# Patient Record
Sex: Male | Born: 1960 | Race: White | Hispanic: No | Marital: Married | Smoking: Never smoker
Health system: Southern US, Community
[De-identification: ages and names within clinical notes are randomized; demographics above are authoritative.]

## PROBLEM LIST (undated history)

## (undated) DIAGNOSIS — R5382 Chronic fatigue, unspecified: Secondary | ICD-10-CM

## (undated) DIAGNOSIS — G35 Multiple sclerosis: Secondary | ICD-10-CM

## (undated) DIAGNOSIS — G35D Multiple sclerosis, unspecified: Secondary | ICD-10-CM

## (undated) DIAGNOSIS — G8929 Other chronic pain: Secondary | ICD-10-CM

## (undated) DIAGNOSIS — L409 Psoriasis, unspecified: Secondary | ICD-10-CM

## (undated) HISTORY — PX: EYE SURGERY: SHX253

## (undated) HISTORY — PX: APPENDECTOMY: SHX54

---

## 2015-03-17 ENCOUNTER — Ambulatory Visit
Admission: EM | Admit: 2015-03-17 | Discharge: 2015-03-17 | Disposition: A | Discharge status: Home / Self Care | Payer: 59 | Attending: Family Medicine | Admitting: Family Medicine

## 2015-03-17 ENCOUNTER — Ambulatory Visit: Payer: 59

## 2015-03-17 ENCOUNTER — Encounter: Payer: Self-pay | Admitting: Emergency Medicine

## 2015-03-17 DIAGNOSIS — S9031XA Contusion of right foot, initial encounter: Secondary | ICD-10-CM | POA: Diagnosis not present

## 2015-03-17 DIAGNOSIS — S92901A Unspecified fracture of right foot, initial encounter for closed fracture: Secondary | ICD-10-CM

## 2015-03-17 DIAGNOSIS — W19XXXA Unspecified fall, initial encounter: Secondary | ICD-10-CM | POA: Insufficient documentation

## 2015-03-17 HISTORY — DX: Multiple sclerosis: G35

## 2015-03-17 HISTORY — DX: Multiple sclerosis, unspecified: G35.D

## 2015-03-17 NOTE — ED Notes (Signed)
Right foot injury last night. Fell down stairs. Swollen , bruising with pain.

## 2015-03-17 NOTE — ED Provider Notes (Signed)
Patient presents today with symptoms of right foot pain and swelling since last night. Patient states that he fell after missing the last step. Denies any history of foot injury in the past. Denies any tingling numbness of the foot. Has tried to keep the foot elevated. Patient is on pain medication for MS which has helped with the foot pain. Has been walking on foot with mild limp.  Review of systems negative except mentioned above. Vitals as per Epic.   GENERAL: NAD RESP: CTA B CARD: RRR EXTREM: R Foot- moderate swelling of foot, no significant ecchymosis, generalized tenderness of the mid foot, no tenderness of the ankle, mild decreased range of motion in all directions, neurovascularly intact  A/P:R Foot Injury-xrays suggestive of nondisplaced fx of 4th metatarsal and some bone fragments at 2nd and 3rd metatarsals, unsure of there being a Lisfranc injury, would recommend walking boot at this time, will need further evaluation by orthopedics early part of next week, ice when necessary, encouraged elevation above heart, if any acute problems seek medical attention as discussed. Patient already has pain medication.   Jolene ProvostKirtida Danyah Guastella, MD 03/17/15 [phone]

## 2015-03-17 NOTE — Discharge Instructions (Signed)
Keep walking boot on as much as possible, ice prn, elevate foot above heart, f/u with orthopedics early next week. Seek medical attention if any acute worsening symptoms.

## 2016-06-01 IMAGING — CR DG FOOT COMPLETE 3+V*R*
3 series · 3 of 3 positions shown · non-contrast
Comparison: None.

CLINICAL DATA: Fall, injury yesterday. Missed the last step and
injured right foot. Swelling and bruising along anterior midfoot
region.

EXAM:
RIGHT FOOT COMPLETE - 3+ VIEW

[foot ap]
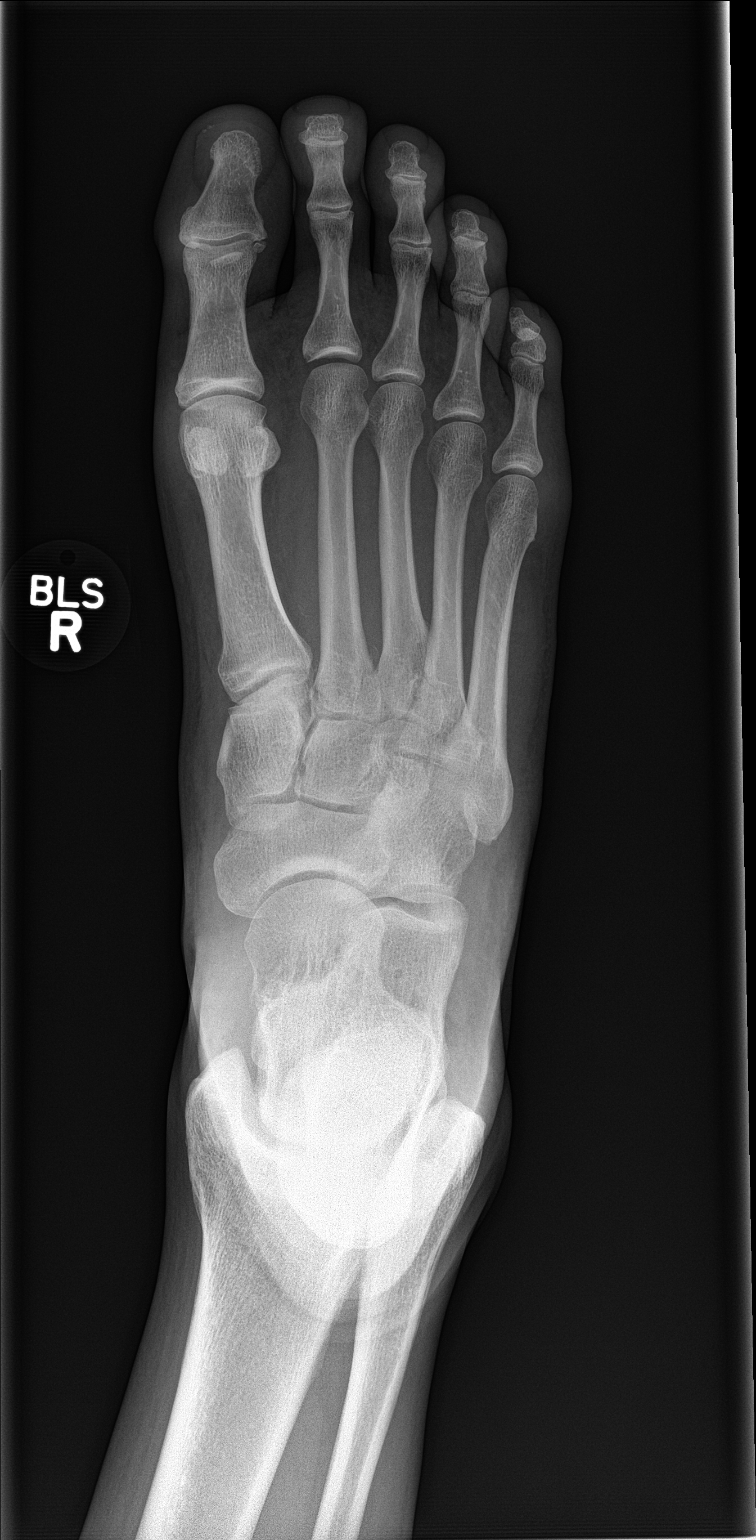

[foot obl]
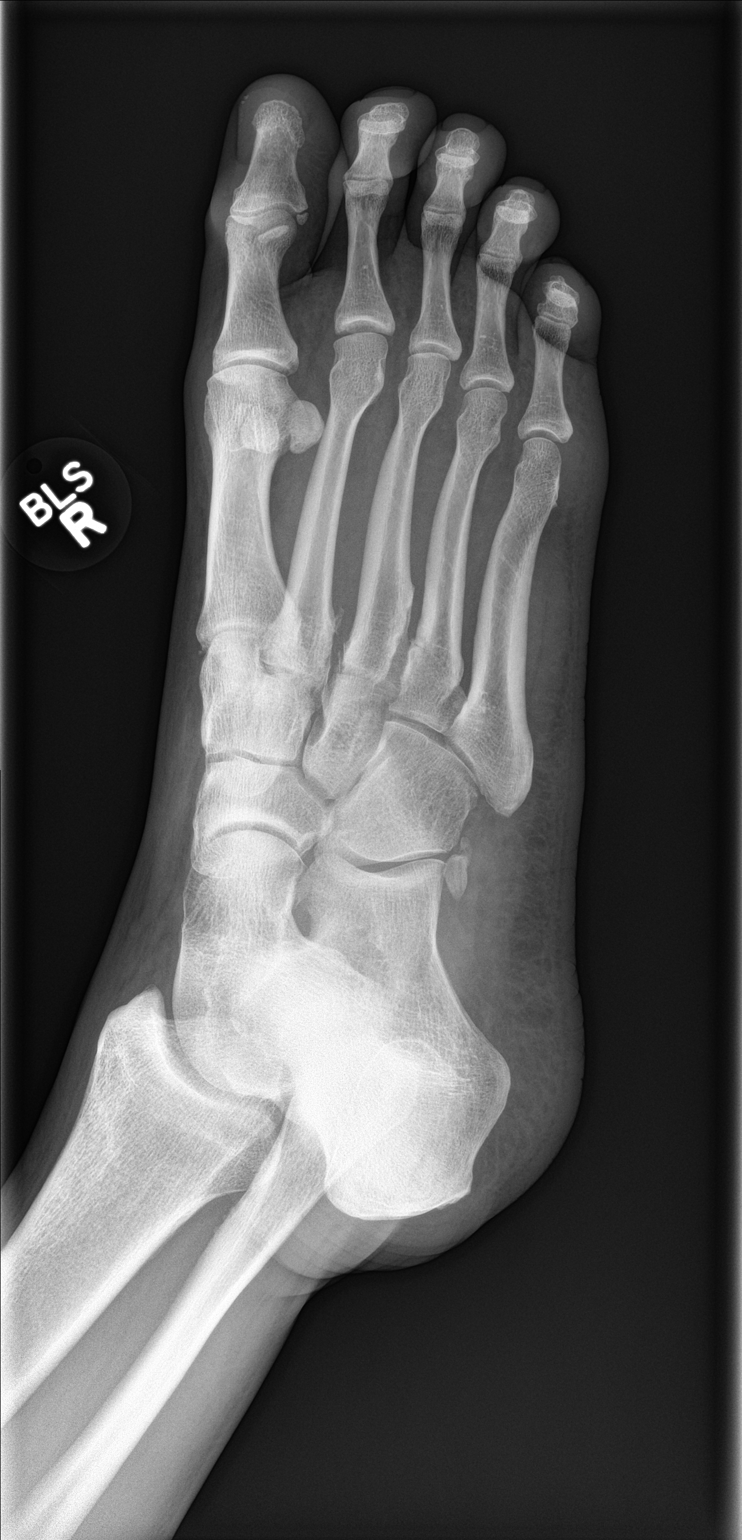

[foot lat]
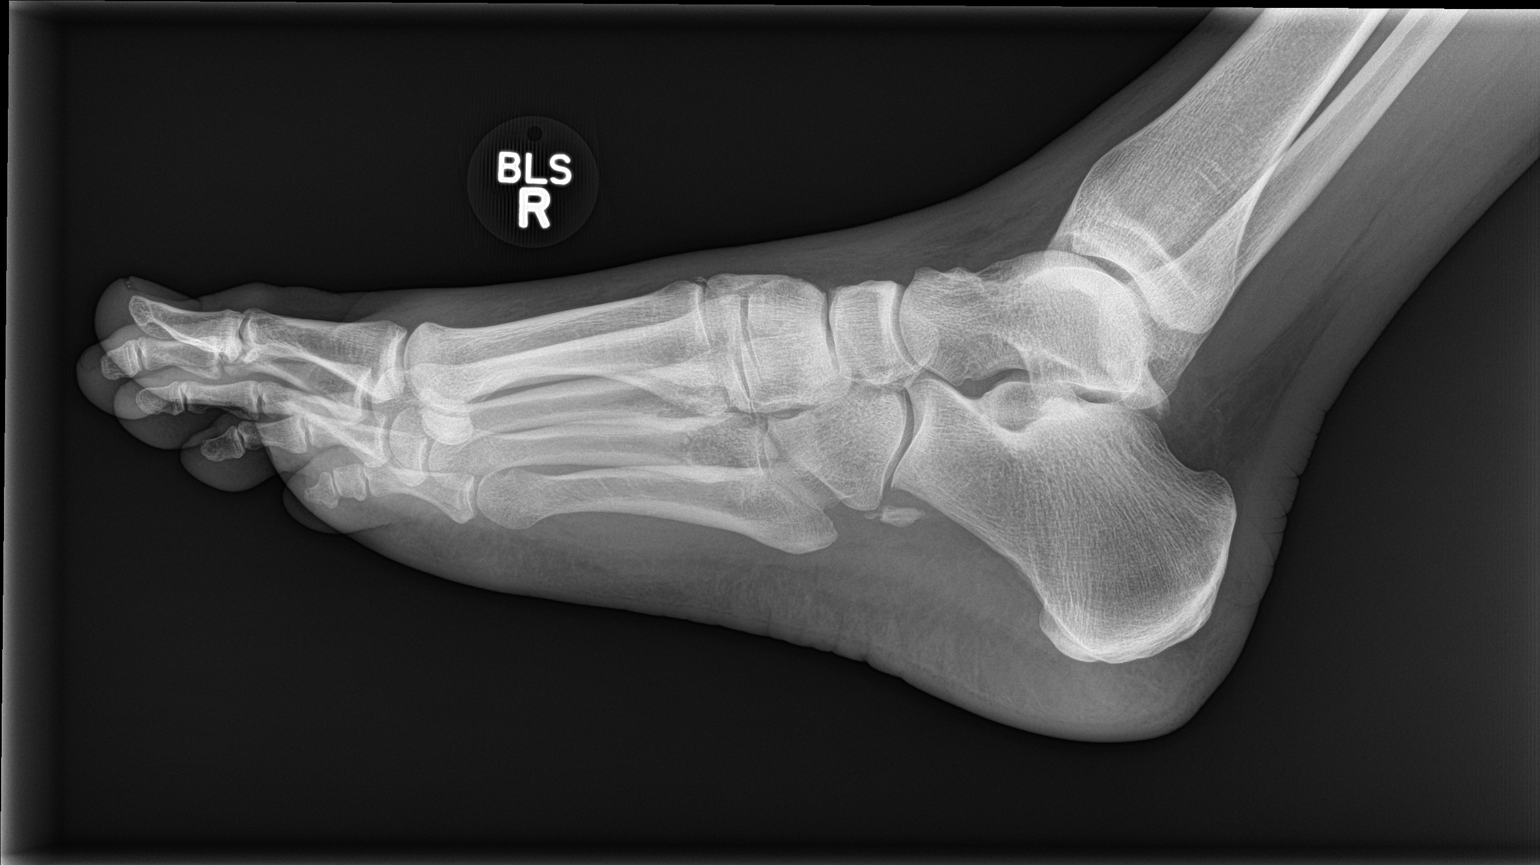

[3 of 3 positions shown; findings below may reference images not displayed]

FINDINGS: Linear lucency across the base of the right fourth metatarsal
concerning for nondisplaced fracture. Bony irregularity small
fragments noted between the second and third metatarsal bases also
concerning for injury/ fracture. Soft tissues are intact. No
additional acute bony abnormality visualized.
IMPRESSION: Concern for nondisplaced fracture through the base of the right
fourth metatarsal. Small bone fragments and irregularity between the
second and third metatarsal bases. If felt clinically indicated, CT
may be beneficial for further characterization.

## 2019-12-02 ENCOUNTER — Ambulatory Visit: Payer: 59

## 2019-12-02 ENCOUNTER — Ambulatory Visit: Payer: 59 | Attending: Internal Medicine

## 2019-12-02 DIAGNOSIS — Z23 Encounter for immunization: Secondary | ICD-10-CM

## 2019-12-02 NOTE — Progress Notes (Signed)
   Covid-19 Vaccination Clinic  Name:  Brian Buck    MRN: [id number] DOB: 1961/06/10  12/02/2019  Brian Buck was observed post Covid-19 immunization for 15 minutes without incident. He was provided with Vaccine Information Sheet and instruction to access the V-Safe system.   Brian Buck was instructed to call 911 with any severe reactions post vaccine: Marland Kitchen Difficulty breathing  . Swelling of face and throat  . A fast heartbeat  . A bad rash all over body  . Dizziness and weakness   Immunizations Administered    Name Date Dose VIS Date Route   Moderna COVID-19 Vaccine 12/02/2019  9:30 AM 0.5 mL 08/16/2019 Intramuscular   Manufacturer: Moderna   Lot: [id number]   NDC: 39030-092-33

## 2020-01-04 ENCOUNTER — Ambulatory Visit: Payer: 59 | Attending: Internal Medicine

## 2020-01-04 DIAGNOSIS — Z23 Encounter for immunization: Secondary | ICD-10-CM

## 2020-01-04 NOTE — Progress Notes (Signed)
   Covid-19 Vaccination Clinic  Name:  Brian Buck    MRN: [id number] DOB: 01-May-1961  01/04/2020  [name] was observed post Covid-19 immunization for 15 minutes without incident. He was provided with Vaccine Information Sheet and instruction to access the V-Safe system.   [name] was instructed to call 911 with any severe reactions post vaccine: Marland Kitchen Difficulty breathing  . Swelling of face and throat  . A fast heartbeat  . A bad rash all over body  . Dizziness and weakness   Immunizations Administered    Name Date Dose VIS Date Route   Moderna COVID-19 Vaccine 01/04/2020  1:08 PM 0.5 mL 08/2019 Intramuscular   Manufacturer: Moderna   Lot: [id number]   NDC: 18550-158-68

## 2022-06-12 ENCOUNTER — Ambulatory Visit: Payer: Self-pay | Admitting: Psychiatry

## 2022-06-13 ENCOUNTER — Ambulatory Visit: Payer: Self-pay | Admitting: Psychiatry

## 2023-06-02 ENCOUNTER — Ambulatory Visit: Payer: BC Managed Care – PPO | Admitting: Gastroenterology

## 2024-07-12 ENCOUNTER — Other Ambulatory Visit: Payer: Self-pay | Admitting: Podiatry

## 2024-07-21 ENCOUNTER — Encounter: Payer: Self-pay | Admitting: Podiatry

## 2024-07-26 NOTE — Anesthesia Preprocedure Evaluation (Signed)
 Anesthesia Evaluation  Patient identified by MRN, date of birth, ID band Patient awake    Reviewed: Allergy & Precautions, H&P , NPO status , Patient's Chart, lab work & pertinent test results  Airway Mallampati: I  TM Distance: >3 FB Neck ROM: Full    Dental no notable dental hx. (+) Caps Caps upper central bilateral incisors:   Pulmonary neg pulmonary ROS   Pulmonary exam normal breath sounds clear to auscultation       Cardiovascular negative cardio ROS Normal cardiovascular exam Rhythm:Regular Rate:Normal     Neuro/Psych negative neurological ROS  negative psych ROS   GI/Hepatic negative GI ROS, Neg liver ROS,,,  Endo/Other  negative endocrine ROS    Renal/GU negative Renal ROS  negative genitourinary   Musculoskeletal negative musculoskeletal ROS (+)    Abdominal   Peds negative pediatric ROS (+)  Hematology negative hematology ROS (+)   Anesthesia Other Findings Multiple sclerosis Psoriasis Has a lot of chronic pain and fatigue from MS, takes Rx for this  Has MS: do not get too warm or cold  Reproductive/Obstetrics negative OB ROS                              Anesthesia Physical Anesthesia Plan  ASA: 3  Anesthesia Plan: MAC   Post-op Pain Management:    Induction: Intravenous  PONV Risk Score and Plan:   Airway Management Planned: Natural Airway and Nasal Cannula  Additional Equipment:   Intra-op Plan:   Post-operative Plan:   Informed Consent: I have reviewed the patients History and Physical, chart, labs and discussed the procedure including the risks, benefits and alternatives for the proposed anesthesia with the patient or authorized representative who has indicated his/her understanding and acceptance.     Dental Advisory Given  Plan Discussed with: Anesthesiologist, CRNA and Surgeon  Anesthesia Plan Comments: (Patient consented for risks of  anesthesia including but not limited to:  - adverse reactions to medications - damage to eyes, teeth, lips or other oral mucosa - nerve damage due to positioning  - sore throat or hoarseness - Damage to heart, brain, nerves, lungs, other parts of body or loss of life  Patient voiced understanding and assent.)        Anesthesia Quick Evaluation

## 2024-07-27 ENCOUNTER — Ambulatory Visit: Payer: Self-pay | Admitting: General Practice

## 2024-07-27 ENCOUNTER — Encounter: Payer: Self-pay | Admitting: Podiatry

## 2024-07-27 ENCOUNTER — Encounter
Admission: RE | Disposition: A | Discharge status: Home / Self Care | Payer: Self-pay | Source: Home / Self Care | Attending: Podiatry

## 2024-07-27 ENCOUNTER — Other Ambulatory Visit: Payer: Self-pay

## 2024-07-27 ENCOUNTER — Ambulatory Visit
Admission: RE | Admit: 2024-07-27 | Discharge: 2024-07-27 | Disposition: A | Discharge status: Home / Self Care | Payer: Self-pay | Attending: Podiatry | Admitting: Podiatry

## 2024-07-27 DIAGNOSIS — D489 Neoplasm of uncertain behavior, unspecified: Secondary | ICD-10-CM | POA: Insufficient documentation

## 2024-07-27 DIAGNOSIS — B070 Plantar wart: Secondary | ICD-10-CM | POA: Insufficient documentation

## 2024-07-27 DIAGNOSIS — M722 Plantar fascial fibromatosis: Secondary | ICD-10-CM | POA: Diagnosis not present

## 2024-07-27 DIAGNOSIS — L989 Disorder of the skin and subcutaneous tissue, unspecified: Secondary | ICD-10-CM | POA: Insufficient documentation

## 2024-07-27 DIAGNOSIS — G35D Multiple sclerosis, unspecified: Secondary | ICD-10-CM | POA: Diagnosis present

## 2024-07-27 HISTORY — PX: LESION REMOVAL: SHX5196

## 2024-07-27 HISTORY — DX: Other chronic pain: G89.29

## 2024-07-27 HISTORY — DX: Chronic fatigue, unspecified: R53.82

## 2024-07-27 HISTORY — PX: EXCISION MASS LOWER EXTREMETIES: SHX6705

## 2024-07-27 HISTORY — DX: Psoriasis, unspecified: L40.9

## 2024-07-27 SURGERY — WIDE EXCISION, LESION, UPPER EXTREMITY
Anesthesia: Monitor Anesthesia Care | Site: Foot | Laterality: Left

## 2024-07-27 MED ORDER — PHENYLEPHRINE HCL (PRESSORS) 10 MG/ML IV SOLN
INTRAVENOUS | Status: DC | PRN
  Administered 2024-07-27 (×3): 80 ug via INTRAVENOUS

## 2024-07-27 MED ORDER — MIDAZOLAM HCL 2 MG/2ML IJ SOLN
INTRAMUSCULAR | Status: AC
  Filled 2024-07-27: qty 2

## 2024-07-27 MED ORDER — SODIUM CHLORIDE 0.9 % IV SOLN: INTRAVENOUS | Status: DC

## 2024-07-27 MED ORDER — ONDANSETRON HCL 4 MG/2ML IJ SOLN
INTRAMUSCULAR | Status: DC | PRN
  Administered 2024-07-27: 4 mg via INTRAVENOUS

## 2024-07-27 MED ORDER — LIDOCAINE-EPINEPHRINE 1 %-1:100000 IJ SOLN
INTRAMUSCULAR | Status: DC | PRN
  Administered 2024-07-27: 9.5 mL

## 2024-07-27 MED ORDER — FENTANYL CITRATE (PF) 100 MCG/2ML IJ SOLN
INTRAMUSCULAR | Status: AC
  Filled 2024-07-27: qty 2

## 2024-07-27 MED ORDER — LACTATED RINGERS IV SOLN: INTRAVENOUS | Status: DC

## 2024-07-27 MED ORDER — MIDAZOLAM HCL (PF) 2 MG/2ML IJ SOLN
INTRAMUSCULAR | Status: DC | PRN
  Administered 2024-07-27: 2 mg via INTRAVENOUS

## 2024-07-27 MED ORDER — BUPIVACAINE HCL (PF) 0.5 % IJ SOLN
INTRAMUSCULAR | Status: DC | PRN
  Administered 2024-07-27: 9.5 mL

## 2024-07-27 MED ORDER — CEFAZOLIN SODIUM-DEXTROSE 2-3 GM-%(50ML) IV SOLR
INTRAVENOUS | Status: AC
Start: 2024-07-27 — End: 2024-07-27
  Filled 2024-07-27: qty 50

## 2024-07-27 MED ORDER — LIDOCAINE HCL (CARDIAC) PF 100 MG/5ML IV SOSY
PREFILLED_SYRINGE | INTRAVENOUS | Status: DC | PRN
  Administered 2024-07-27: 100 mg via INTRAVENOUS

## 2024-07-27 MED ORDER — PROPOFOL 10 MG/ML IV BOLUS
INTRAVENOUS | Status: DC | PRN
  Administered 2024-07-27: 200 mg via INTRAVENOUS

## 2024-07-27 MED ORDER — PROPOFOL 10 MG/ML IV BOLUS
INTRAVENOUS | Status: AC
  Filled 2024-07-27: qty 20

## 2024-07-27 MED ORDER — HYDROCODONE-ACETAMINOPHEN 5-325 MG PO TABS: 1.0000 | ORAL_TABLET | Freq: Four times a day (QID) | ORAL | 0 refills | Status: AC | PRN

## 2024-07-27 MED ORDER — FENTANYL CITRATE (PF) 100 MCG/2ML IJ SOLN
INTRAMUSCULAR | Status: DC | PRN
  Administered 2024-07-27: 100 ug via INTRAVENOUS

## 2024-07-27 MED ORDER — CEFAZOLIN SODIUM-DEXTROSE 2-4 GM/100ML-% IV SOLN
2.0000 g | INTRAVENOUS | Status: AC
  Administered 2024-07-27: 2 g via INTRAVENOUS

## 2024-07-27 MED ORDER — PROPOFOL 500 MG/50ML IV EMUL: INTRAVENOUS | Status: DC | PRN

## 2024-07-27 MED ORDER — EPHEDRINE SULFATE (PRESSORS) 25 MG/5ML IV SOSY
PREFILLED_SYRINGE | INTRAVENOUS | Status: DC | PRN
  Administered 2024-07-27 (×2): 10 mg via INTRAVENOUS
  Administered 2024-07-27: 5 mg via INTRAVENOUS
  Administered 2024-07-27: 10 mg via INTRAVENOUS

## 2024-07-27 MED ORDER — LIDOCAINE HCL (PF) 2 % IJ SOLN
INTRAMUSCULAR | Status: AC
  Filled 2024-07-27: qty 5

## 2024-07-27 MED ORDER — GLYCOPYRROLATE 0.2 MG/ML IJ SOLN
INTRAMUSCULAR | Status: DC | PRN
  Administered 2024-07-27: .2 mg via INTRAVENOUS

## 2024-07-27 MED ORDER — DEXAMETHASONE SODIUM PHOSPHATE 4 MG/ML IJ SOLN
INTRAMUSCULAR | Status: DC | PRN
  Administered 2024-07-27: 4 mg via INTRAVENOUS

## 2024-07-27 SURGICAL SUPPLY — 34 items
BENZOIN TINCTURE PRP APPL 2/3 (GAUZE/BANDAGES/DRESSINGS) ×4 IMPLANT
BNDG COHESIVE 4X5 TAN STRL LF (GAUZE/BANDAGES/DRESSINGS) ×4 IMPLANT
BNDG ESMARCH 4X12 STRL LF (GAUZE/BANDAGES/DRESSINGS) ×4 IMPLANT
BNDG GAUZE DERMACEA FLUFF 4 (GAUZE/BANDAGES/DRESSINGS) ×4 IMPLANT
BNDG STRETCH 4X75 STRL LF (GAUZE/BANDAGES/DRESSINGS) ×4 IMPLANT
CANISTER SUCT 1200ML W/VALVE (MISCELLANEOUS) ×4 IMPLANT
COVER LIGHT HANDLE UNIVERSAL (MISCELLANEOUS) ×4 IMPLANT
DURAPREP 26ML APPLICATOR (WOUND CARE) ×4 IMPLANT
ELECTRODE REM PT RTRN 9FT ADLT (ELECTROSURGICAL) ×4 IMPLANT
GAUZE SPONGE 4X4 12PLY STRL (GAUZE/BANDAGES/DRESSINGS) ×4 IMPLANT
GAUZE XEROFORM 1X8 LF (GAUZE/BANDAGES/DRESSINGS) ×4 IMPLANT
GAUZE XEROFORM 5X9 LF (GAUZE/BANDAGES/DRESSINGS) ×2 IMPLANT
GLOVE BIOGEL PI IND STRL 8 (GLOVE) ×4 IMPLANT
GLOVE SURG SS PI 7.5 STRL IVOR (GLOVE) ×4 IMPLANT
GLOVE SURG UNDER LTX SZ8 (GLOVE) ×2 IMPLANT
GOWN SPEC L4 XLG W/TWL (GOWN DISPOSABLE) ×4 IMPLANT
GOWN STRL REUS W/ TWL LRG LVL3 (GOWN DISPOSABLE) ×6 IMPLANT
KIT PRC PRB RTRGD 3ANG KNF HND (MISCELLANEOUS) ×2 IMPLANT
KIT TURNOVER KIT A (KITS) ×4 IMPLANT
NDL HYPO 25GX1X1/2 BEV (NEEDLE) ×2 IMPLANT
NEEDLE HYPO 25GX1X1/2 BEV (NEEDLE) ×4 IMPLANT
NS IRRIG 500ML POUR BTL (IV SOLUTION) ×4 IMPLANT
PACK EXTREMITY ARMC (MISCELLANEOUS) ×4 IMPLANT
PENCIL SMOKE EVACUATOR (MISCELLANEOUS) ×4 IMPLANT
SOLUTION ANTFG W/FOAM PAD STRL (MISCELLANEOUS) ×1 IMPLANT
STOCKINETTE IMPERVIOUS LG (DRAPES) ×4 IMPLANT
STRAP BODY AND KNEE 60X3 (MISCELLANEOUS) ×2 IMPLANT
STRIP CLOSURE SKIN 1/4X4 (GAUZE/BANDAGES/DRESSINGS) ×4 IMPLANT
SUT MNCRL+ 5-0 UNDYED PC-3 (SUTURE) ×2 IMPLANT
SUT VIC AB 3-0 SH 27X BRD (SUTURE) ×1 IMPLANT
SUT VIC AB 4-0 RB1 27X BRD (SUTURE) ×2 IMPLANT
SUTURE EHLN 3-0 FS-10 30 BLK (SUTURE) ×2 IMPLANT
SUTURE MNCRL 4-0 27XMF (SUTURE) ×1 IMPLANT
SYR 10ML LL (SYRINGE) ×2 IMPLANT

## 2024-07-27 NOTE — Anesthesia Procedure Notes (Addendum)
 Procedure Name: LMA Insertion Date/Time: 07/27/2024 12:21 PM  Performed by: Myra Lawless, CRNAPre-anesthesia Checklist: Patient identified, Patient being monitored, Timeout performed, Emergency Drugs available and Suction available Patient Re-evaluated:Patient Re-evaluated prior to induction Oxygen Delivery Method: Circle system utilized Preoxygenation: Pre-oxygenation with 100% oxygen Induction Type: IV induction Ventilation: Mask ventilation without difficulty LMA: LMA inserted LMA Size: 5.0 Tube type: Oral Number of attempts: 1 Placement Confirmation: positive ETCO2 and breath sounds checked- equal and bilateral Tube secured with: Tape Dental Injury: Teeth and Oropharynx as per pre-operative assessment

## 2024-07-27 NOTE — Anesthesia Postprocedure Evaluation (Signed)
 Anesthesia Post Note  Patient: Brian Buck  Procedure(s) Performed: WIDE EXCISION, LESION, left foot (Left: Foot) EXCISION MASS LOWER EXTREMITIES (Left: Foot)  Patient location during evaluation: PACU Anesthesia Type: MAC Level of consciousness: awake and alert Pain management: pain level controlled Vital Signs Assessment: post-procedure vital signs reviewed and stable Respiratory status: spontaneous breathing, nonlabored ventilation, respiratory function stable and patient connected to nasal cannula oxygen Cardiovascular status: blood pressure returned to baseline and stable Postop Assessment: no apparent nausea or vomiting Anesthetic complications: no   No notable events documented.   Last Vitals:  Vitals:   07/27/24 1400 07/27/24 1411  BP: 131/75 111/88  Pulse: 87   Resp: 13   Temp:  36.7 C  SpO2: 97%     Last Pain:  Vitals:   07/27/24 1116  TempSrc: Temporal  PainSc: 4                  Lanissa Cashen C Teresea Donley

## 2024-07-27 NOTE — Transfer of Care (Signed)
 Immediate Anesthesia Transfer of Care Note  Patient: Brian Buck  Procedure(s) Performed: WIDE EXCISION, LESION, left foot (Left: Foot) EXCISION MASS LOWER EXTREMITIES (Left: Foot)  Patient Location: PACU  Anesthesia Type: MAC  Level of Consciousness: awake, alert  and patient cooperative  Airway and Oxygen Therapy: Patient Spontanous Breathing and Patient connected to supplemental oxygen  Post-op Assessment: Post-op Vital signs reviewed, Patient's Cardiovascular Status Stable, Respiratory Function Stable, Patent Airway and No signs of Nausea or vomiting  Post-op Vital Signs: Reviewed and stable  Complications: No notable events documented.

## 2024-07-27 NOTE — Op Note (Signed)
 Operative note   Surgeon:Reginald Weida [employer]support[employer]administrative Officer: None    Preop diagnosis: 1.  Plantar fibroma plantar left midfoot 2.  Plantar fibroma medial forefoot 3.  Superficial skin lesion dorsal medial left foot 4.  Superficial skin lesion dorsal    Postop diagnosis: Same    Procedure: 1.  Wide excision plantar fibroma plantar central left foot 2.  Wide excision plantar fibroma left medial forefoot 3.  Excision skin lesion 1.6 cm dorsal medial skin lesion 4.  Excision lesion 2.0 cm dorsal central left foot    EBL: Minimal    Anesthesia:local and general this of a total of 20 cc of one-to-one mixture of 0.5% plain bupivacaine and 1% lidocaine with epinephrine infiltrated along all surgical sites    Hemostasis: Lidocaine with epinephrine    Specimen: 1.  Plantar fibroma plantar central left foot 2.  Plantar fibroma medial left forefoot 3.  Superficial skin lesion dorsal medial left foot 4.  Superficial skin lesion dorsal central left foot    Complications:none    Operative indications:Brian Buck is an 63 y.o. that presents today for surgical intervention.  The risks/benefits/alternatives/complications have been discussed and consent has been given.    Procedure:  Patient was brought into the OR and placed on the operating table in thesupine position. After anesthesia was obtained theleft lower extremity was prepped and draped in usual sterile fashion.  Was initially directed to the plantar aspect of the left foot where the soft tissue fibroma had been marked out previously.  A curvilinear incision was performed.  Sharp and blunt dissection carried down to the fibroma.  This was excised with a wide excision with a combination of scissors and a scalpel blade.  The musculature and was noted adipose tissue was noted just deep to the excision of the fibroma site.  Lesion itself measured approximately 3 x 2 cm.  With copious pus for irrigation.  Layered closure was performed with a 3-0 Vicryl for  the subcutaneous tissue and a 4-0 Monocryl for the skin.  Attention was next directed to the distal medial aspect along the medial band of the plantar fascia just proximal to the metatarsophalangeal joint where a curvilinear incision was performed.  Sharp and blunt dissection carried down to the fibroma itself.  This was then dissected away from the superficial soft tissue.  Wide excision was performed with a combination of 15 blade and scissors.  The lesion measured approximately 2-1/2 x 2 cm.  With copious amounts of irrigation.  The underlying soft tissue was consistent with muscle and adipose tissue.  Closure was then performed with a 4-0 Vicryl subcutaneous tissue and a 3-0 nylon for the skin.  Was then directed to the dorsal medial left foot where superficial skin lesion was noted.  Most consistent grossly with a verrucous lesion.  A 3-1 excision was performed.  This measured 1.6 cm in length.  The lesion was then excised from the underlying superficial tissue.  Closure was then performed simple with a 3-0 nylon.  Finally the dorsal central superficial skin lesion was mapped out and a 3-1 2 cm in length incision was performed around the lesion.  This was dissected away from the superficial tissue and sent for pathological examination as well as all other lesions in separate specimens cups.  Those with a 3-0 nylon.    Patient tolerated the procedure and anesthesia well.  Was transported from the OR to the PACU with all vital signs stable and vascular status intact. To  be discharged per routine protocol.  Will follow up in approximately 1 week in the outpatient clinic.

## 2024-07-27 NOTE — Discharge Instructions (Signed)

## 2024-07-27 NOTE — H&P (Signed)
 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  The various methods of treatment have been discussed with the patient.  No guarantees were given.  After consideration of risks, benefits and other options for treatment, the patient has consented to surgery.  I have reviewed the patients' chart and labs.     A history and physical examination was performed in my office.  The patient was reexamined.  There have been no changes to this history and physical examination.  Ashley Soulier A

## 2024-07-29 LAB — SURGICAL PATHOLOGY
